# Patient Record
Sex: Male | Born: 1950 | Race: White | Hispanic: No | Marital: Single | Smoking: Never smoker
Health system: Southern US, Community
[De-identification: ages and names within clinical notes are randomized; demographics above are authoritative.]

## PROBLEM LIST (undated history)

## (undated) DIAGNOSIS — E785 Hyperlipidemia, unspecified: Secondary | ICD-10-CM

## (undated) DIAGNOSIS — F32A Depression, unspecified: Secondary | ICD-10-CM

## (undated) DIAGNOSIS — F419 Anxiety disorder, unspecified: Secondary | ICD-10-CM

## (undated) DIAGNOSIS — M1990 Unspecified osteoarthritis, unspecified site: Secondary | ICD-10-CM

## (undated) DIAGNOSIS — F329 Major depressive disorder, single episode, unspecified: Secondary | ICD-10-CM

## (undated) DIAGNOSIS — T7840XA Allergy, unspecified, initial encounter: Secondary | ICD-10-CM

## (undated) DIAGNOSIS — E119 Type 2 diabetes mellitus without complications: Secondary | ICD-10-CM

## (undated) DIAGNOSIS — K219 Gastro-esophageal reflux disease without esophagitis: Secondary | ICD-10-CM

## (undated) HISTORY — DX: Major depressive disorder, single episode, unspecified: F32.9

## (undated) HISTORY — DX: Type 2 diabetes mellitus without complications: E11.9

## (undated) HISTORY — DX: Unspecified osteoarthritis, unspecified site: M19.90

## (undated) HISTORY — DX: Anxiety disorder, unspecified: F41.9

## (undated) HISTORY — DX: Gastro-esophageal reflux disease without esophagitis: K21.9

## (undated) HISTORY — DX: Hyperlipidemia, unspecified: E78.5

## (undated) HISTORY — DX: Allergy, unspecified, initial encounter: T78.40XA

## (undated) HISTORY — DX: Depression, unspecified: F32.A

---

## 1971-03-05 HISTORY — PX: NASAL SINUS SURGERY: SHX719

## 1999-12-02 ENCOUNTER — Inpatient Hospital Stay (HOSPITAL_COMMUNITY): Admission: EM | Admit: 1999-12-02 | Discharge: 1999-12-08 | Payer: Self-pay

## 1999-12-02 ENCOUNTER — Encounter: Payer: Self-pay | Admitting: Internal Medicine

## 1999-12-02 ENCOUNTER — Ambulatory Visit (HOSPITAL_COMMUNITY): Admission: RE | Admit: 1999-12-02 | Discharge: 1999-12-02 | Payer: Self-pay | Admitting: Internal Medicine

## 2000-01-18 ENCOUNTER — Encounter: Payer: Self-pay | Admitting: General Surgery

## 2000-01-18 ENCOUNTER — Ambulatory Visit (HOSPITAL_COMMUNITY): Admission: RE | Admit: 2000-01-18 | Discharge: 2000-01-18 | Payer: Self-pay | Admitting: General Surgery

## 2004-10-03 ENCOUNTER — Encounter: Admission: RE | Admit: 2004-10-03 | Discharge: 2004-10-03 | Payer: Self-pay | Admitting: Internal Medicine

## 2005-05-06 ENCOUNTER — Encounter: Admission: RE | Admit: 2005-05-06 | Discharge: 2005-05-06 | Payer: Self-pay | Admitting: Internal Medicine

## 2006-05-20 ENCOUNTER — Encounter: Admission: RE | Admit: 2006-05-20 | Discharge: 2006-05-20 | Payer: Self-pay | Admitting: Internal Medicine

## 2006-05-29 ENCOUNTER — Encounter: Admission: RE | Admit: 2006-05-29 | Discharge: 2006-05-29 | Payer: Self-pay | Admitting: Internal Medicine

## 2007-03-16 ENCOUNTER — Encounter: Admission: RE | Admit: 2007-03-16 | Discharge: 2007-03-16 | Payer: Self-pay | Admitting: Internal Medicine

## 2007-12-10 ENCOUNTER — Ambulatory Visit: Payer: Self-pay | Admitting: Internal Medicine

## 2007-12-22 ENCOUNTER — Ambulatory Visit: Payer: Self-pay | Admitting: Internal Medicine

## 2008-03-18 ENCOUNTER — Ambulatory Visit: Payer: Self-pay | Admitting: Internal Medicine

## 2008-05-26 ENCOUNTER — Ambulatory Visit: Payer: Self-pay | Admitting: Internal Medicine

## 2008-07-12 ENCOUNTER — Ambulatory Visit: Payer: Self-pay | Admitting: Internal Medicine

## 2008-08-16 ENCOUNTER — Ambulatory Visit: Payer: Self-pay | Admitting: Internal Medicine

## 2008-08-22 IMAGING — CR DG SINUSES COMPLETE 3+V
4 series · 4 of 4 positions shown · non-contrast
Comparison: None.

CLINICAL DATA: Left maxillary sinus pain and pressure.
 SINUSES ? 3 VIEW:

[view not recorded (1 of 4)]
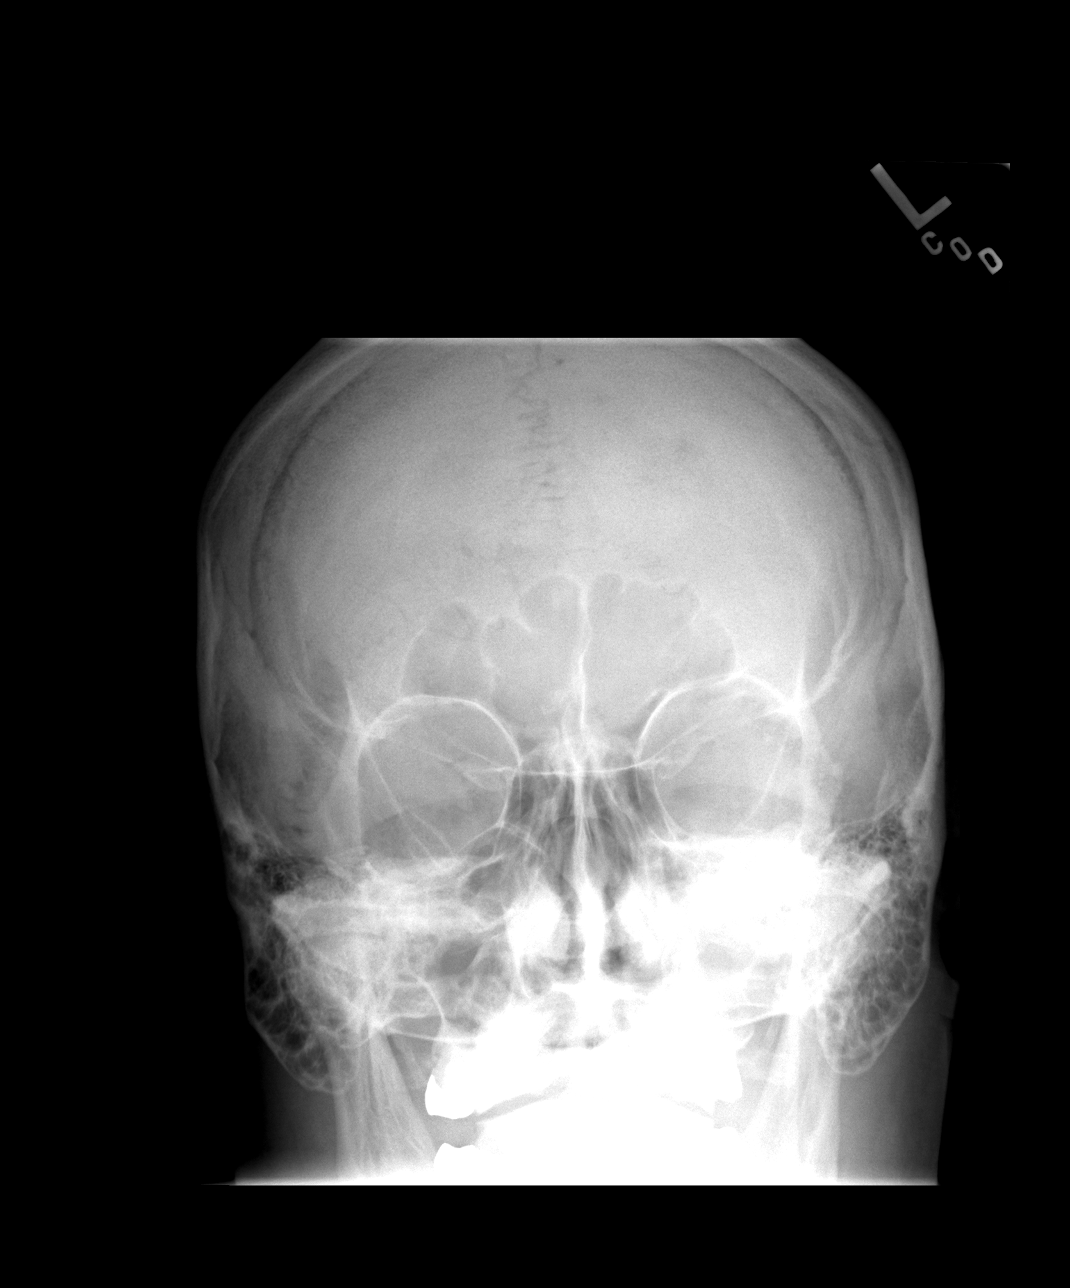

[view not recorded (2 of 4)]
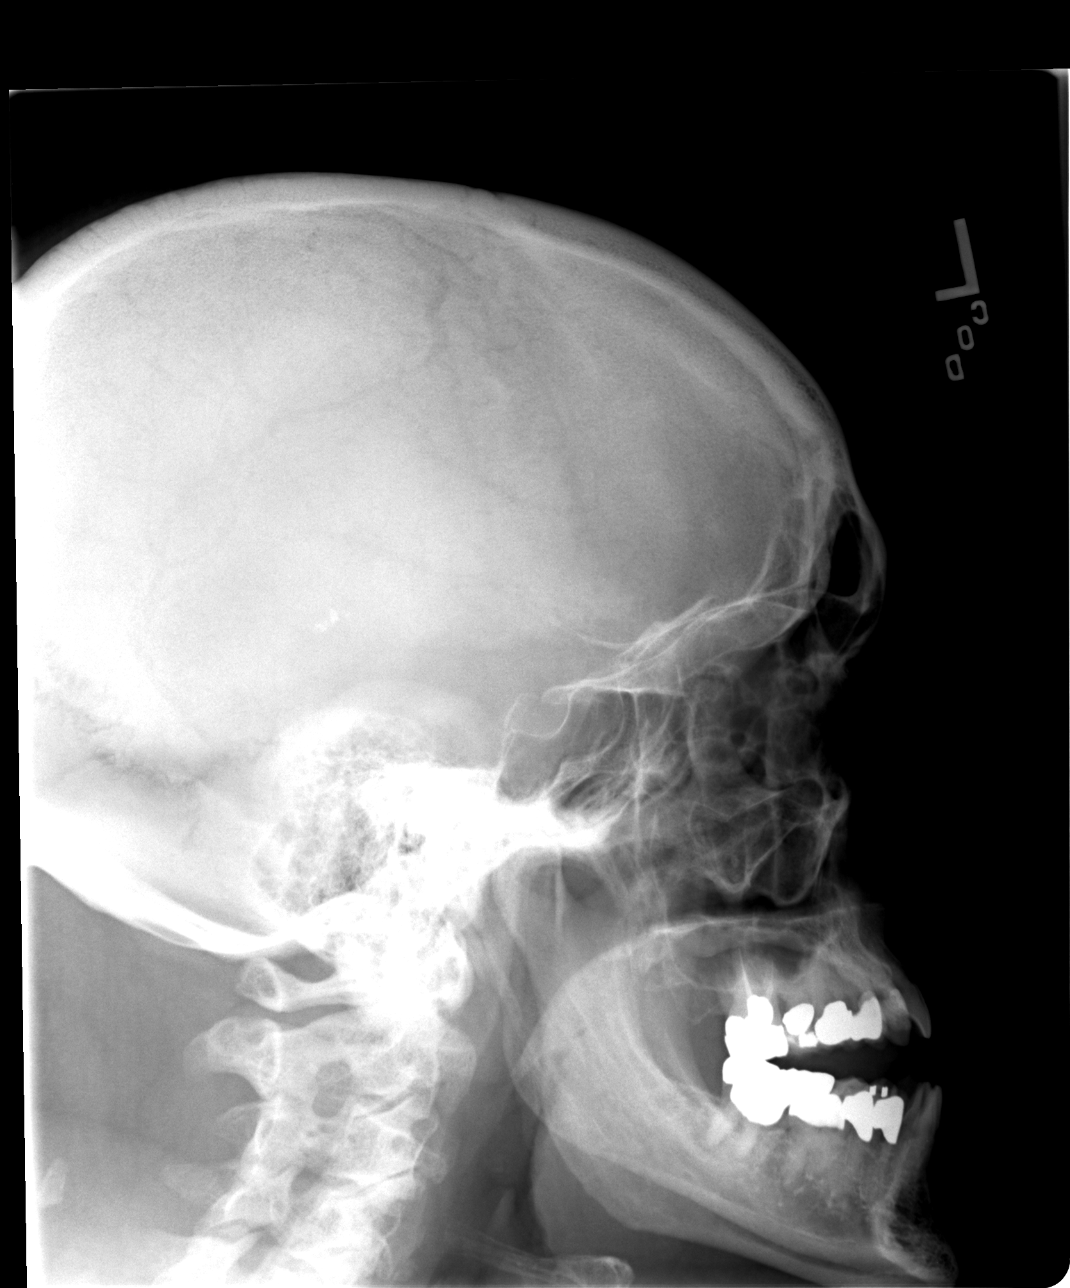

[view not recorded (3 of 4)]
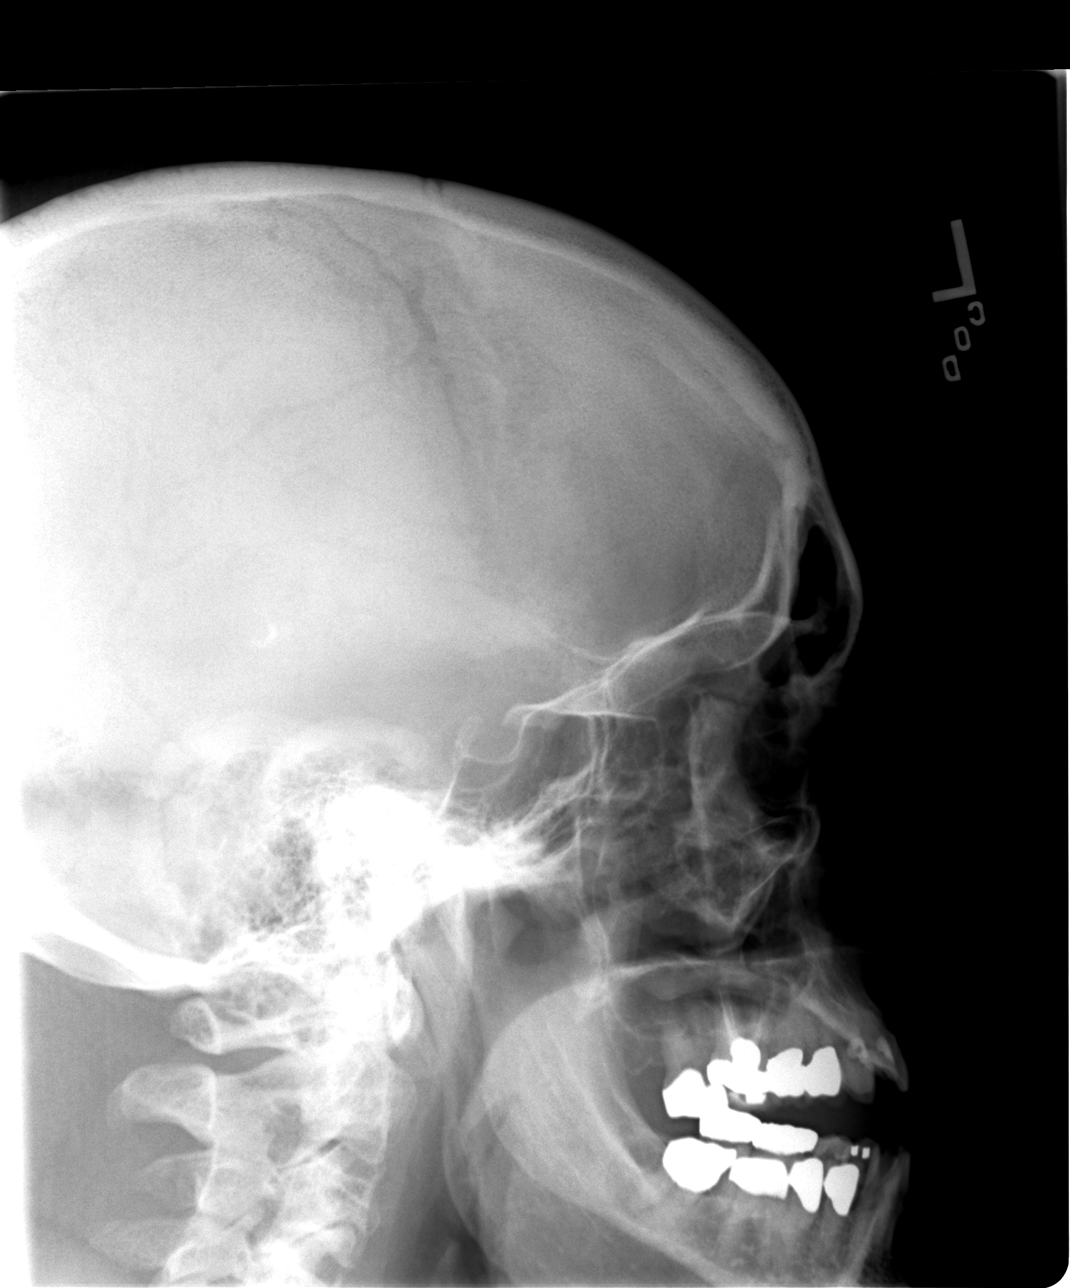

[view not recorded (4 of 4)]
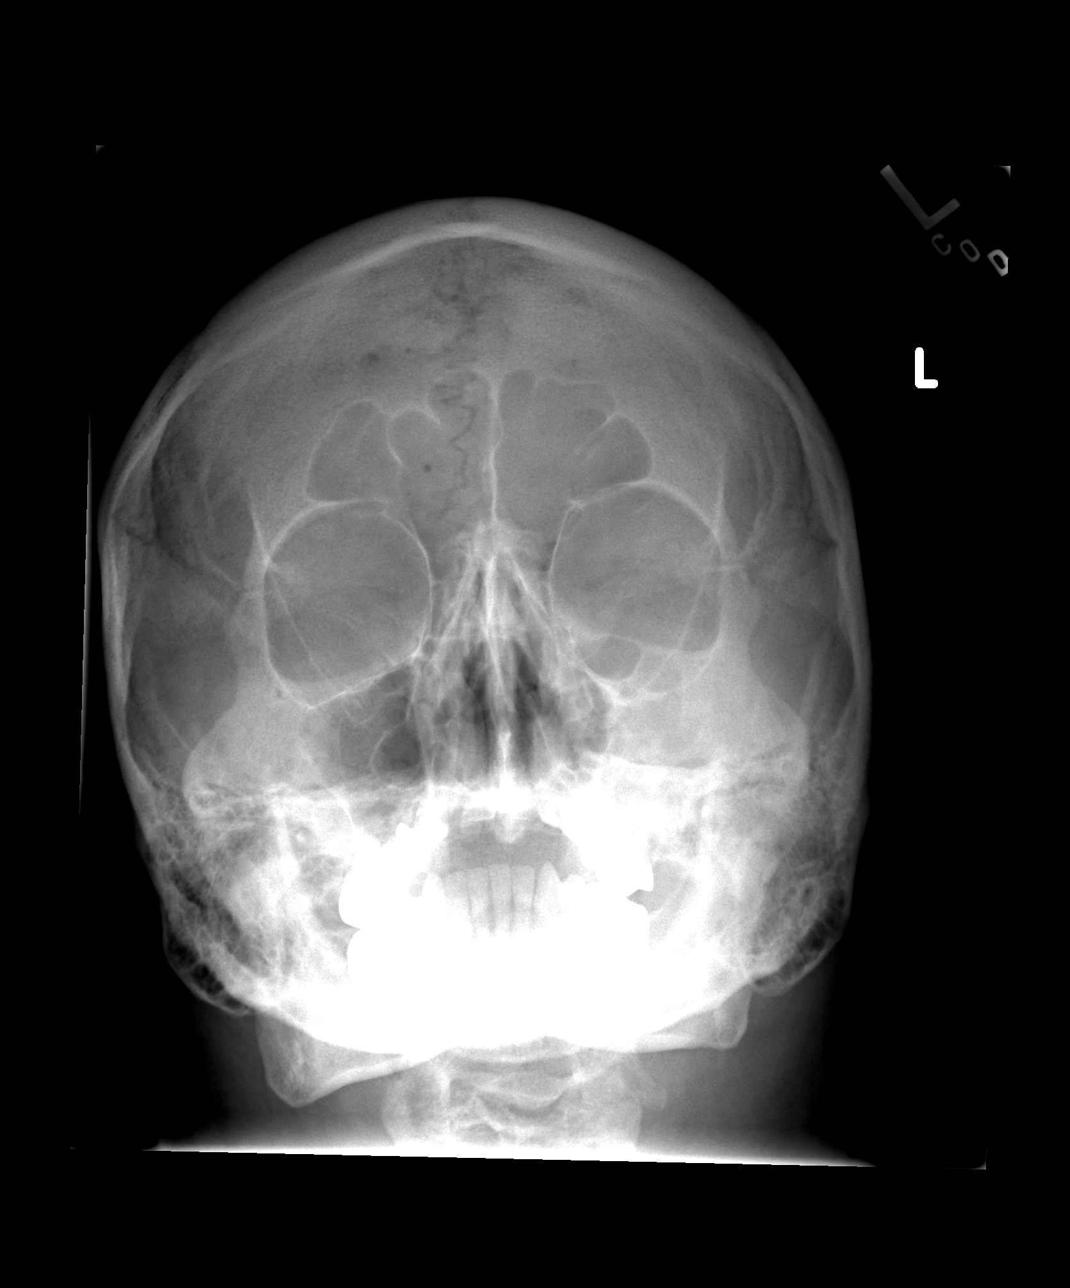

[4 of 4 positions shown; findings below may reference images not displayed]

FINDINGS: The margins of the sphenoid sinus are indistinct with clouding consistent with mucosal thickening.  There is no definite air fluid level.  The petrous bone traversing the sinus simulates an air fluid level but I do not think this is a   true abnormality.
IMPRESSION: Findings most consistent with chronic left maxillary sinusitis ? no definite acute findings.

## 2009-02-06 ENCOUNTER — Ambulatory Visit: Payer: Self-pay | Admitting: Internal Medicine

## 2009-03-13 ENCOUNTER — Ambulatory Visit: Payer: Self-pay | Admitting: Internal Medicine

## 2009-03-27 ENCOUNTER — Ambulatory Visit: Payer: Self-pay | Admitting: Internal Medicine

## 2009-04-17 ENCOUNTER — Ambulatory Visit: Payer: Self-pay | Admitting: Internal Medicine

## 2009-08-15 ENCOUNTER — Ambulatory Visit: Payer: Self-pay | Admitting: Internal Medicine

## 2009-10-09 ENCOUNTER — Ambulatory Visit: Payer: Self-pay | Admitting: Internal Medicine

## 2010-07-20 NOTE — Discharge Summary (Signed)
[facility]  Patient:    Joseph Romero, Joseph Romero                      MRN: [id number] Adm. Date:  [id number] Disc. Date: [id number] Attending:  Brandy Hale CC:         Eula Listen, M.D., [facility]  Luanna Cole. Lenord Fellers, M.D.  Florencia Reasons, M.D.   Discharge Summary  FINAL DIAGNOSES: 1. Acute diverticulitis.  Rule out infectious colitis and ischemic colitis. 2. Bipolar disorder.  OPERATIONS PERFORMED:  None.  HISTORY:  This is a 60 year old white man who has not had any history of colon problems or intestinal disease in the past.  He awoke Saturday morning, September 29, with bilateral lower abdominal pain.  This pain became worse later on in the evening, and he developed intermittent severe spasms.  He had not had a bowel movement for 48 hours at the time of admission.  He had been a little bit nauseated but had not vomited.  He stated that he had had fever and chills but had not had any rigors.  [facility].  Blood work showed a white cell count of 15,300 and a hemoglobin of 14.5.  CT scan at [facility] showed a marked thickening of the sigmoid colon with surrounding mesenteric stranding.  There was no abscess or fluid collection.  There was one tiny air bubble inside the colon which might be extra luminal or might be a diverticula.  This was felt to be clearly consistent with inflammatory process in the sigmoid colon.  I was asked to see the patient, and I admitted him to the hospital for management of presumed acute diverticulitis.  PAST MEDICAL HISTORY:  Bipolar disorder followed by Dr. Jodi Marble.  History of gastroesophageal reflux disease.  History of hemorrhoids.  History of childhood bronchitis and asthma.  History of left maxillary sinus surgery. History of bilateral carpal tunnel syndrome.  Colonoscopy one year previously by Dr. Matthias Hughs which was reportedly normal.  Evaluated for  mild elevation in liver function tests and nausea and vomiting one year previously and reports that the workup was negative.  He states that he has negative hepatitis workup and negative HIV testing.  CURRENT MEDICATIONS: 1. Desyrel 100 mg q.h.s. 2. Valium 5 mg q.i.d. 3. Restoril 10 mg q.h.s. p.r.n. sleep. 4. Depakote for bipolar disorder daily.  DRUG ALLERGIES:  TETRACYCLINE, FIORINAL, and DRIXORAL.  PHYSICAL EXAMINATION:  GENERAL:  A pleasant, overweight white man in mild distress.  Occasional severe spasms of pain.  VITAL SIGNS:  Temperature 101.8, heart rate 108, respiratory rate 20, blood pressure 171/104.  HEENT:  Sclerae clear.  NECK:  Supple, no mass.  LUNGS:  Clear.  HEART:  Regular rate and rhythm.  No murmur or rubs.  ABDOMEN:  Somewhat obese and soft.  Bowel sounds hypoactive.  The abdomen was quite tender with involuntary guarding and localized rebound in the left lower quadrant and to a lesser degree in the right lower quadrant.  No mass.  Not distended.  ADMISSION DATA:  WBC 15,100, hemoglobin 14.5.  Complete metabolic panel showed an ALT of 91, total bilirubin 1.3.  Urinalysis normal.  HOSPITAL COURSE:  The patient was admitted to the hospital.  IV fluids were started.  Broad-spectrum antibiotics were started.  He was made n.p.o.  Over the next couple of days, the patient spiked fevers to 101 and 102 but generally started  feeling a little bit better.  He had no nausea or vomiting. White blood cell count returned to normal after a couple of days.  Fevers slowly resolved over the next three to four days, but he did spike up to 101.6 on the third hospital day.  He advanced his diet and activity slowly but without complication.  He was able to be discharged on October 6.  At the time of discharge, he was afebrile.  He had stable vital signs, was tolerating a regular diet, had had bowel movements.  His abdomen was soft and essentially nontender and  without any mass.  He was given a prescription for Augmentin 875 mg p.o. q.12h. for about one week.  He was asked to return to see me in the office in two weeks.  Diet and activity were discussed.  He was told that he would need further GI workup as an outpatient, either a barium enema or a colonoscopy. DD:  12/19/99 TD:  12/19/99 Job: [id number]

## 2010-07-20 NOTE — H&P (Signed)
[facility]  Patient:    Joseph Romero, Joseph Romero                      MRN: [id number] Adm. Date:  [id number] Attending:  Brandy Hale CC:         Eula Listen, M.D., [facility]  Luanna Cole. Lenord Fellers, M.D.  Florencia Reasons, M.D.  Dub Amis, M.D.   History and Physical  CHIEF COMPLAINT:  Lower abdominal pain.  HISTORY OF PRESENT ILLNESS:  This is a 60 year old white man who is being admitted for management for of diverticulitis.  She did not have any history of prior colon problems or chronic intestinal processes.  He awoke Saturday morning, December 01, 1999 with bilateral lower abdominal pain.  This pan was steady and uncomfortable but not severe.  Several hours later, last night, he developed severe intermittent episodes of cramps and spasms which was quite uncomfortable and this became progressive and more severe during the night. He states he has not had a bowel movement for 48 hours.  He has been a little bit nauseated but has not vomited.  He has had fever and chills but denies rigors.  He states the pain is 8/10 on a scale of 1-10 when he has the severe cramps.  He was seen by Dr. Eula Listen at [facility] today.  Blood work revealed a white blood cell count of 15,300 and hemoglobin of 14.5.  He was sent to [facility] where a CT scan shows marked thickening of the sigmoid colon with surrounding mesenteric stranding.  There is no abscess or fluid collection.  There is one tiny air bubble beside the colon which might be extraluminal or might be in a diverticula.  This was felt to be consistent with diverticulitis, infectious colitis or ischemic colitis, although there was no evidence of vascular disease.  The patient is admitted for management of presumed acute diverticulitis.  PAST MEDICAL HISTORY:  The patient has a history of bipolar disorder and is followed by Dr. Jodi Marble and is under good control.  He has  a history of gastroesophageal reflux disease but this is mild.  He has a history of hemorrhoids but this is mild.  He has a history of childhood bronchitis and asthma.  He had a left maxillary sinus surgery by Dr. Lyman Bishop in 1973. He has bilateral carpal tunnel syndrome.  He had a colonoscopy one year ago by Dr. Matthias Hughs, which was reportedly normal.  He was evaluated for mild elevation of his liver function tests and nausea and vomiting a year or so ago and his work-up was completely negative.  Specifically, he states he had a negative hepatitis work-up and negative HIV testing.  CURRENT MEDICATIONS:  Desyrel 150 mg p.o. q.h.s., Valium 5 mg q.i.d, Restoril 10 mg q.h.s. p.r.n. sleep, Depakote for bipolar disorder daily.  DRUG ALLERGIES:  CODEINE, TETRACYCLINE, FLUOIRINAL and DRIXORAL.  FAMILY HISTORY:  Mother died age 60.  She had rheumatoid arthritis, Cushings syndrome and congestive heart failure.  Father died of COPD and non-insulin-dependent diabetes mellitus.  One sister living and well and one brother has asthma.  SOCIAL HISTORY:  The patient admittedly has a homosexual lifestyle.  He is here with his partner.  He denies use of tobacco, drinks two glasses of wine per week.  He works as a Social research officer, government.  REVIEW OF SYSTEMS:  All systems were reviewed and negative except as described above.  PHYSICAL  EXAMINATION:  GENERAL:  A pleasant, overweight white man who is in mild distress usually, but in severe distress when he has an intermittent lower abdominal cramp.  VITAL SIGNS:  Temperature 101.8, heart rate 108 and regular, respiratory rate 20 and unlabored.  Blood pressure 171/104.  HEENT:  Sclerae clear.  Extraocular movements intact.  There is a well-healed scar on the left nasolabial fold from previous surgery.  Oropharynx clear.  NECK:  Supple, nontender, no mass, no adenopathy, no thyromegaly, no bruits.  LUNGS:  Clear to auscultation, no CVA  tenderness.  HEART:  Regular rate and rhythm with no murmur.  ABDOMEN:  Somewhat obese and soft with hypoactive bowel sounds.  The abdomen is quite tender with involuntary guarding and localized rebound in the left lower quadrant and the right lower quadrant.  There is no mass.  He is not distended.  GENITALIA:  Normal penis, scrotum and testes.  RECTAL EXAM:  Deferred.  EXTREMITIES:  No edema, good pulses.  NEUROLOGIC:  Grossly within normal limits.  IMPRESSION: 1. Acute diverticulitis, moderately severe but without evidence of surgical    complication.  Infectious colitis and ischemic colitis seems much less    likely given his age and history. 2. Bipolar disorder.  PLAN:  The patient will be admitted.  We will give IV fluid rehydration, broad spectrum IV antibiotics and bowel rest.  It my hope that his acute inflammatory episode will resolve without surgical intervention and allow outpatient work-up at a later date. DD:  12/02/99 TD:  12/02/99 Job: [id number]

## 2011-09-13 ENCOUNTER — Ambulatory Visit: Payer: Self-pay | Admitting: Internal Medicine

## 2011-09-13 VITALS — BP 166/84 | HR 74 | Temp 99.6°F | Resp 16 | Ht 68.0 in | Wt 290.0 lb

## 2011-09-13 DIAGNOSIS — M7918 Myalgia, other site: Secondary | ICD-10-CM

## 2011-09-13 DIAGNOSIS — I10 Essential (primary) hypertension: Secondary | ICD-10-CM

## 2011-09-13 DIAGNOSIS — IMO0001 Reserved for inherently not codable concepts without codable children: Secondary | ICD-10-CM

## 2011-09-13 MED ORDER — GABAPENTIN 300 MG PO CAPS: 300.0000 mg | ORAL_CAPSULE | Freq: Two times a day (BID) | ORAL | Status: DC

## 2011-09-13 MED ORDER — METOPROLOL TARTRATE 50 MG PO TABS: 50.0000 mg | ORAL_TABLET | Freq: Two times a day (BID) | ORAL | Status: DC

## 2011-09-13 NOTE — Patient Instructions (Signed)
Take meds as directed

## 2011-09-13 NOTE — Progress Notes (Signed)
  Subjective:    Patient ID: Joseph Romero, male    DOB: August 07, 1950, 61 y.o.   MRN: [id number]  HPI Pain left hip are Moderate in severity 6/10 Radiates down the left leg No numbness  Increases with motion Occurred after moving some boxes when he moved.  Also need refillon bp meds      Review of Systems  Constitutional: Negative.   HENT: Negative.   Eyes: Negative.   Respiratory: Negative.   Cardiovascular: Negative.   Gastrointestinal: Negative.   Genitourinary: Negative.   Musculoskeletal: Positive for myalgias.  Skin: Negative.   Neurological: Negative.   Hematological: Negative.   Psychiatric/Behavioral: Negative.   All other systems reviewed and are negative.       Objective:   Physical Exam  Nursing note and vitals reviewed. Constitutional: He is oriented to person, place, and time. He appears well-developed.  HENT:  Head: Normocephalic and atraumatic.  Right Ear: External ear normal.  Left Ear: External ear normal.  Nose: Nose normal.  Mouth/Throat: Oropharynx is clear and moist.  Eyes: Conjunctivae and EOM are normal. Pupils are equal, round, and reactive to light.  Neck: Normal range of motion.  Cardiovascular: Normal rate, regular rhythm and normal heart sounds.   Pulmonary/Chest: Effort normal and breath sounds normal.  Abdominal: Soft. Bowel sounds are normal.  Musculoskeletal: He exhibits tenderness.       l gluteal region  Neurological: He is alert and oriented to person, place, and time.  Skin: Skin is warm and dry.  Psychiatric: He has a normal mood and affect. His behavior is normal. Judgment normal.          Assessment & Plan:  Musculoskeletal in jury will rx his gabapentin and refill his bp meds. Also suggest nsai for musculoskeletal pain

## 2012-12-06 ENCOUNTER — Ambulatory Visit: Payer: Self-pay | Admitting: Physician Assistant

## 2012-12-06 VITALS — BP 132/68 | HR 72 | Temp 99.0°F | Resp 16 | Ht 66.0 in | Wt 285.0 lb

## 2012-12-06 DIAGNOSIS — I10 Essential (primary) hypertension: Secondary | ICD-10-CM

## 2012-12-06 DIAGNOSIS — M7918 Myalgia, other site: Secondary | ICD-10-CM

## 2012-12-06 DIAGNOSIS — R21 Rash and other nonspecific skin eruption: Secondary | ICD-10-CM

## 2012-12-06 DIAGNOSIS — IMO0001 Reserved for inherently not codable concepts without codable children: Secondary | ICD-10-CM

## 2012-12-06 DIAGNOSIS — Z23 Encounter for immunization: Secondary | ICD-10-CM

## 2012-12-06 MED ORDER — GABAPENTIN 300 MG PO CAPS: 300.0000 mg | ORAL_CAPSULE | Freq: Two times a day (BID) | ORAL | Status: DC

## 2012-12-06 MED ORDER — METOPROLOL TARTRATE 50 MG PO TABS: 50.0000 mg | ORAL_TABLET | Freq: Two times a day (BID) | ORAL | Status: DC

## 2012-12-06 MED ORDER — ZOSTER VACCINE LIVE 19400 UNT/0.65ML ~~LOC~~ SOLR: 0.6500 mL | Freq: Once | SUBCUTANEOUS | Status: AC

## 2012-12-06 MED ORDER — PERMETHRIN 5 % EX CREA: TOPICAL_CREAM | Freq: Once | CUTANEOUS | Status: AC

## 2012-12-06 NOTE — Progress Notes (Signed)
  Subjective:    Patient ID: Joseph Romero, male    DOB: [birth year], 62 y.o.   MRN: [id number]  Rash Pertinent negatives include no fever or vomiting.   62 year old male presents for several concerns   #1) Refills of metoprolol and neurontin.  Stable on both. Uses Neurontin only as needed for pain.  Takes metoprolol twice daily. BP controlled and current dose.  Denies chest pain, palpitations, SOB, dizziness, or headache. Has not had labwork in over a year since he quit his job at [employer]. Will qualify for Medicare when he turns 63 so plans to have complete physical at that time.    #2) Rash on arms and legs x 1 week.  Had similar rash 8 months ago that was as scabies with Permethrin crm. Admits this did clear up the rash and he has been symptom free until 1 week ago. Rash is intensely pruritic and does seem to be spreading.  Denies fever, chills, nausea, vomiting, headache, or abdominal pain.   #3) Requesting prescription for shingles vaccine. Is concerned that he has not had it yet. His sister had shingles and he does not want to go through that.    Patient is otherwise doing well with no other concerns today.     Review of Systems  Constitutional: Negative for fever, chills and unexpected weight change.  Cardiovascular: Negative for chest pain and palpitations.  Gastrointestinal: Negative for nausea, vomiting and abdominal pain.  Skin: Positive for rash.  Neurological: Negative for dizziness and headaches.       Objective:   Physical Exam  Constitutional: He is oriented to person, place, and time. He appears well-developed and well-nourished.  HENT:  Head: Normocephalic and atraumatic.  Right Ear: External ear normal.  Left Ear: External ear normal.  Eyes: Conjunctivae are normal.  Neck: Normal range of motion. Neck supple.  Cardiovascular: Normal rate, regular rhythm and normal heart sounds.   Pulmonary/Chest: Effort normal and breath sounds normal.  Lymphadenopathy:     He has no cervical adenopathy.  Neurological: He is alert and oriented to person, place, and time.  Psychiatric: He has a normal mood and affect. His behavior is normal. Judgment and thought content normal.          Assessment & Plan:  Hypertension - Plan: metoprolol (LOPRESSOR) 50 MG tablet  Rash and other nonspecific skin eruption - Plan: permethrin (ACTICIN) 5 % cream  Musculoskeletal pain - Plan: gabapentin (NEURONTIN) 300 MG capsule  Need for shingles vaccine - Plan: zoster vaccine live, PF, (ZOSTAVAX) 40981 UNT/0.65ML injection Patient declined labwork today due to cost Refilled medications. Plan to have CPE with labs upon receiving medicare Rx written for shingles vaccine Follow up here as needed.

## 2013-12-08 ENCOUNTER — Ambulatory Visit (INDEPENDENT_AMBULATORY_CARE_PROVIDER_SITE_OTHER): Payer: Commercial Managed Care - HMO | Admitting: Family Medicine

## 2013-12-08 VITALS — BP 148/98 | HR 80 | Temp 99.5°F | Resp 18 | Ht 68.5 in | Wt 293.0 lb

## 2013-12-08 DIAGNOSIS — M791 Myalgia: Secondary | ICD-10-CM

## 2013-12-08 DIAGNOSIS — M7918 Myalgia, other site: Secondary | ICD-10-CM

## 2013-12-08 DIAGNOSIS — I10 Essential (primary) hypertension: Secondary | ICD-10-CM

## 2013-12-08 DIAGNOSIS — L853 Xerosis cutis: Secondary | ICD-10-CM

## 2013-12-08 DIAGNOSIS — Z23 Encounter for immunization: Secondary | ICD-10-CM

## 2013-12-08 MED ORDER — GABAPENTIN 300 MG PO CAPS: 300.0000 mg | ORAL_CAPSULE | Freq: Two times a day (BID) | ORAL | Status: AC

## 2013-12-08 MED ORDER — METOPROLOL SUCCINATE ER 100 MG PO TB24: 100.0000 mg | ORAL_TABLET | Freq: Every day | ORAL | Status: DC

## 2013-12-08 NOTE — Progress Notes (Signed)
Subjective: Patient is here for recheck on his medications and refills. He does okay on the gabapentin. He says that everybody in his family that was obese as livelong however by that was in his died young. He has not get a lot of exercise. He is retired for the last 4 or 5 years. Is very sedentary. He asked about the pneumococcal vaccine. He would like to go to a extended release metoprolol rather than twice a day. [place] getting ready to go to [place]. Complains of rash.  Objective: Very obese man in no acute distress. Throat clear. Neck supple without nodes. Chest clear. Heart regular without murmurs. Abdomen soft nontender. No ankle edema. Dry skin with a few old chigger bites on his legs.  Assessment: Neuropathy right leg Hypertension Dry skin  Plan: I had ordered labs on him, but he decided he did not want those at this time. He has an appointment with another Dr. next month. Did give him his first pneumonia vaccine. Prevnar. Needs the other one next time. Pneumovax. Advise weight loss and exercise. Dry skin could be from hypothyroidism, aging, numerous things. He may have some chiggar bites also.

## 2013-12-08 NOTE — Patient Instructions (Addendum)
Continue taking metoprolol 100 mg daily of the time release  Continue the gabapentin  Get your laboratory tests done next visit. I recommend that you get a thyroid test done and a diabetic test at that time also.  Return if problems arise  There are 2 types of pneumonia vaccine available now, which covered different strains. You have received the Prevnar. It is recommended you also get the Pneumovax after a couple of months or sometime within the next year.

## 2017-08-18 ENCOUNTER — Ambulatory Visit: Payer: Medicare HMO | Admitting: *Deleted

## 2017-08-22 ENCOUNTER — Encounter: Payer: Self-pay | Admitting: *Deleted

## 2017-08-22 ENCOUNTER — Encounter: Payer: Medicare HMO | Attending: Family Medicine | Admitting: *Deleted

## 2017-08-22 VITALS — BP 150/84 | Ht 68.0 in | Wt 265.7 lb

## 2017-08-22 DIAGNOSIS — Z6841 Body Mass Index (BMI) 40.0 and over, adult: Secondary | ICD-10-CM | POA: Insufficient documentation

## 2017-08-22 DIAGNOSIS — Z713 Dietary counseling and surveillance: Secondary | ICD-10-CM | POA: Diagnosis not present

## 2017-08-22 DIAGNOSIS — E119 Type 2 diabetes mellitus without complications: Secondary | ICD-10-CM | POA: Diagnosis not present

## 2017-08-22 NOTE — Patient Instructions (Addendum)
Exercise:  Gradually increase exercise to 150 minutes/week  Eat 3 meals day,  1-2  snacks a day Space meals 4-6 hours apart Don't skip meals  Complete 3 Day Food Record and bring to next appt  Return for appointment on:  Monday September 22, 2017 [facility]

## 2017-08-22 NOTE — Progress Notes (Signed)
Diabetes Self-Management Education  Visit Type: First/Initial  Appt. Start Time: 1320 Appt. End Time: 1430  08/22/2017  [name], identified by name and date of birth, is a 67 y.o. male with a diagnosis of Diabetes: Type 2.   ASSESSMENT  Blood pressure (!) 150/84, height 5\' 8"  (1.727 m), weight 265 lb 11.2 oz (120.5 kg). Body mass index is 40.4 kg/m.  Diabetes Self-Management Education - 08/22/17 1512      Visit Information   Visit Type  First/Initial      Initial Visit   Diabetes Type  Type 2    Are you currently following a meal plan?  Yes    What type of meal plan do you follow?  "no more sweets"    Are you taking your medications as prescribed?  No He is not taking Metformin or Lisinopril that was prescribed.     Date Diagnosed  May 2019 per A1C - pt reports "I am not claiming it" - He also reports [employer] nurse came to his house and told him he didn't have diabetes.       Health Coping   How would you rate your overall health?  Good      Psychosocial Assessment   Patient Belief/Attitude about Diabetes  Other (comment) "I do not believe I have diabetes"    Self-care barriers  None    Self-management support  Doctor's office;Friends    Patient Concerns  Nutrition/Meal planning;Medication;Weight Control;Healthy Lifestyle    Special Needs  None    Preferred Learning Style  Visual    Learning Readiness  Change in progress    How often do you need to have someone help you when you read instructions, pamphlets, or other written materials from your doctor or pharmacy?  1 - Never    What is the last grade level you completed in school?  BA      Pre-Education Assessment   Patient understands the diabetes disease and treatment process.  Needs Instruction    Patient understands incorporating nutritional management into lifestyle.  Needs Instruction    Patient undertands incorporating physical activity into lifestyle.  Needs Instruction    Patient understands using  medications safely.  Needs Instruction    Patient understands monitoring blood glucose, interpreting and using results  Needs Instruction    Patient understands prevention, detection, and treatment of acute complications.  Needs Instruction    Patient understands prevention, detection, and treatment of chronic complications.  Needs Instruction    Patient understands how to develop strategies to address psychosocial issues.  Needs Instruction    Patient understands how to develop strategies to promote health/change behavior.  Needs Instruction      Complications   Last HgB A1C per patient/outside source  6.5 % 07/03/17    How often do you check your blood sugar?  Patient declines He reports "There is no way that I am going to stick my finger"    Have you had a dilated eye exam in the past 12 months?  Yes    Have you had a dental exam in the past 12 months?  Yes    Are you checking your feet?  Yes    How many days per week are you checking your feet?  7      Dietary Intake   Breakfast  skips or eats crackers and hummus or boiled egg with banana    Snack (morning)  nuts    Lunch  beef, egg, fish, pork -  broccoli, sweet potatoes, pita bread, green beans, white beans, pinto beans, cauliflower    Snack (afternoon)  nuts    Dinner  skips    Beverage(s)  water, coffee      Exercise   Exercise Type  ADL's gardening      Patient Education   Previous Diabetes Education  No    Disease state   Definition of diabetes, type 1 and 2, and the diagnosis of diabetes    Nutrition management   Role of diet in the treatment of diabetes and the relationship between the three main macronutrients and blood glucose level;Food label reading, portion sizes and measuring food.    Physical activity and exercise   Role of exercise on diabetes management, blood pressure control and cardiac health.    Monitoring  Interpreting lab values - A1C, lipid, urine microalbumina.;Identified appropriate SMBG and/or A1C goals.     Chronic complications  Relationship between chronic complications and blood glucose control    Psychosocial adjustment  Identified and addressed patients feelings and concerns about diabetes      Individualized Goals (developed by patient)   Reducing Risk Decrease medications Lose weight Lead a healthier lifestyle     Outcomes   Expected Outcomes  Demonstrated interest in learning. Expect positive outcomes    Future DMSE  4-6 wks       Individualized Plan for Diabetes Self-Management Training:   Learning Objective:  Patient will have a greater understanding of diabetes self-management. Patient education plan is to attend individual and/or group sessions per assessed needs and concerns.   Plan:   Patient Instructions  Exercise:  Gradually increase exercise to 150 minutes/week Eat 3 meals day,  1-2  snacks a day Space meals 4-6 hours apart Don't skip meals Complete 3 Day Food Record and bring to next appt Return for appointment on:  Monday September 22, 2017 at 1:30 pm with Pam (dietitian)  Expected Outcomes:  Demonstrated interest in learning. Expect positive outcomes  Education material provided:  General Meal Planning Guidelines Simple Meal Plan 3 Day Food Record  If problems or questions, patient to contact team via:  Sharion SettlerSheila Shotwell, RN, CCM, CDE [phone]  Future DSME appointment: 4-6 wks  September 22, 2017 with the dietitian

## 2017-09-22 ENCOUNTER — Encounter: Payer: Self-pay | Admitting: Dietician

## 2017-09-22 ENCOUNTER — Encounter: Payer: Medicare HMO | Attending: Family Medicine | Admitting: Dietician

## 2017-09-22 VITALS — BP 138/80 | Wt 261.2 lb

## 2017-09-22 DIAGNOSIS — E119 Type 2 diabetes mellitus without complications: Secondary | ICD-10-CM

## 2017-09-22 DIAGNOSIS — Z6841 Body Mass Index (BMI) 40.0 and over, adult: Secondary | ICD-10-CM | POA: Insufficient documentation

## 2017-09-22 DIAGNOSIS — Z713 Dietary counseling and surveillance: Secondary | ICD-10-CM | POA: Insufficient documentation

## 2017-09-22 NOTE — Patient Instructions (Addendum)
Balance meals with 2-4 oz protein, 2-4 servings of carbohydrate (starch, fruit, yogurt/milk) and "free vegetables." Read labels for carbohydrate. Can subtract fiber.  For snacks, can include 1 serving of carbohydrate and small protein serving ( meat, peanut butter, nuts, cheese-limit cheese due to saturated fat. When weather improves, walk for exercise as many days as possible and continue to walk inside stores when weather does not allow.

## 2017-09-22 NOTE — Progress Notes (Addendum)
Diabetes Self-Management Education  Visit Type:  Follow-up  Appt. Start Time: 1330 Appt. End Time: 1430  09/22/2017  [name], identified by name and date of birth, is a 67 y.o. male with a diagnosis of Diabetes:  Type 2.   ASSESSMENT  Weight 261 lb 3.2 oz (118.5 kg).  BP-138/80 Body mass index is 39.72 kg/m.   Diabetes Self-Management Education - 09/22/17 1817      Complications   Last HgB A1C per patient/outside source  6.5 %    How often do you check your blood sugar?  0 times/day (not testing)    Have you had a dilated eye exam in the past 12 months?  Yes    Have you had a dental exam in the past 12 months?  Yes    Are you checking your feet?  Yes    How many days per week are you checking your feet?  7      Dietary Intake   Breakfast  Gets up early; eats a banana; goes back to bed and gets up at 10:00am- eats hummus wrapped in Romaine lettuce or pimento cheese on [employer]    Snack (morning)  Drinks 1 pot of coffee throughout the morning (7-8cups); Adds 1 Tbsp of honey to pot as well as evaporated milk and cocoa powder; no additional sugar    Lunch  4:00pm- salmon with butter, peas or beans (1/2 can) or fish with wilted lettuce    Snack (afternoon)  almonds or English walnuts    Snack (evening)  At 10:00pm, eats 1/2 cup blueberries, 2-3 Tbsp. of yogurt and 1/2 cup of ice cream.  Also takes some hemp oik and balsamic vinegar    Beverage(s)  1 liter of water daily, 8 cups of coffee,       Exercise   Exercise Type  Light (walking / raking leaves)    How many days per week to you exercise?  3    How many minutes per day do you exercise?  30    Total minutes per week of exercise  90      Patient Education   Nutrition management   Carbohydrate counting;Food label reading, portion sizes and measuring food.;Meal options for control of blood glucose level and chronic complications.    Physical activity and exercise   Role of exercise on diabetes management, blood  pressure control and cardiac health.      Post-Education Assessment   Patient understands the diabetes disease and treatment process.  Demonstrates understanding / competency    Patient understands incorporating nutritional management into lifestyle.  Needs Review    Patient undertands incorporating physical activity into lifestyle.  Demonstrates understanding / competency    Patient understands using medications safely.  -- does not take diabetes medications    Patient understands monitoring blood glucose, interpreting and using results  -- Patient does not want to monitor glucose    Patient understands prevention, detection, and treatment of acute complications.  Demonstrates understanding / competency    Patient understands prevention, detection, and treatment of chronic complications.  Needs Review    Patient understands how to develop strategies to promote health/change behavior.  Demonstrates understanding / competency      Outcomes   Program Status  Completed       Learning Objective:  Patient will have a greater understanding of diabetes self-management. Patient education plan is to attend individual and/or group sessions per assessed needs and concerns. Education: [employer]  and "Planning a Balance Meal" to teach carbohydrate counting and how to better balance carbohydrate, protein and non-starchy vegetables. Encouraged to establish a more consistent meal plan of 3 meals spaced 4-5 hours apart. Gave and reviewed sample menus showing how to adapt to his food preferences. Also, stressed importance of consistent exercise for weight management and glucose control.   Patient Instructions  Balance meals with 2-4 oz protein, 2-4 servings of carbohydrate (starch, fruit, yogurt/milk) and "free vegetables." Read labels for carbohydrate. Can subtract fiber.  For snacks, can include 1 serving of carbohydrate and small protein serving ( meat, peanut butter, nuts, cheese-limit  cheese due to saturated fat. When weather improves, walk for exercise as many days as possible. Continue to walk inside at the stores when weather does not allow.  Expected Outcomes:  Demonstrated interest in learning. Expect positive outcomes  Education material provided:  Food Guide Plate (for Diabetes) Planning a Balanced Meal Balanced menu suggestions  If problems or questions, patient to contact team via:  Darrel Reacholleen Omarian Jaquith, RD,CDE [phone] Future DSME appointment: - PRN

## 2022-01-31 ENCOUNTER — Ambulatory Visit: Payer: Medicare HMO | Admitting: Dermatology
